# Patient Record
Sex: Male | Born: 1985 | Hispanic: Yes | Marital: Married | Smoking: Never smoker
Health system: Southern US, Community
[De-identification: ages and names within clinical notes are randomized; demographics above are authoritative.]

## PROBLEM LIST (undated history)

## (undated) DIAGNOSIS — E559 Vitamin D deficiency, unspecified: Secondary | ICD-10-CM

## (undated) DIAGNOSIS — K219 Gastro-esophageal reflux disease without esophagitis: Secondary | ICD-10-CM

## (undated) DIAGNOSIS — D709 Neutropenia, unspecified: Secondary | ICD-10-CM

## (undated) DIAGNOSIS — R0982 Postnasal drip: Secondary | ICD-10-CM

## (undated) HISTORY — DX: Gastro-esophageal reflux disease without esophagitis: K21.9

## (undated) HISTORY — DX: Vitamin D deficiency, unspecified: E55.9

## (undated) HISTORY — DX: Postnasal drip: R09.82

## (undated) HISTORY — DX: Neutropenia, unspecified: D70.9

---

## 2005-06-21 HISTORY — PX: TONSILLECTOMY: SUR1361

## 2014-06-12 ENCOUNTER — Encounter (HOSPITAL_COMMUNITY): Payer: Self-pay | Admitting: Emergency Medicine

## 2014-06-12 ENCOUNTER — Emergency Department (HOSPITAL_COMMUNITY)
Admission: EM | Admit: 2014-06-12 | Discharge: 2014-06-12 | Disposition: A | Discharge status: Home / Self Care | Payer: 59 | Source: Home / Self Care | Attending: Emergency Medicine | Admitting: Emergency Medicine

## 2014-06-12 DIAGNOSIS — L989 Disorder of the skin and subcutaneous tissue, unspecified: Secondary | ICD-10-CM

## 2014-06-12 NOTE — ED Notes (Signed)
C/o small abrasion on lateral malleolus of left foot onset 3 weeks Mild pain; 1/10; just concerned it maybe infected Denies inj/trauma Steady gait; NAD Alert, no signs of acute distress.

## 2014-06-12 NOTE — ED Provider Notes (Signed)
CSN: [id number]     Arrival date & time 06/12/14  1012 History   First MD Initiated Contact with Patient 06/12/14 1054     Chief Complaint  Patient presents with  . Abrasion   (Consider location/radiation/quality/duration/timing/severity/associated sxs/prior Treatment) HPI Comments: Patient presents for evaluation of skin lesion that has been present on his left lateral ankle at lateral malleolus for the past 3 weeks. States area began as a small area of redness and was pruritic for the first week. Was never raised and no drainage or blistering has occurred. Cannot recall injury. No additional skin lesions noted. Feels otherwise well and reports himself to be in good health.  PCP: none   The history is provided by the patient.    History reviewed. No pertinent past medical history. History reviewed. No pertinent past surgical history. No family history on file. History  Substance Use Topics  . Smoking status: Never Smoker   . Smokeless tobacco: Not on file  . Alcohol Use: Yes    Review of Systems  All other systems reviewed and are negative.   Allergies  Review of patient's allergies indicates no known allergies.  Home Medications   Prior to Admission medications   Not on File   BP 119/86 mmHg  Pulse 91  Temp(Src) 98.2 F (36.8 C) (Oral)  Resp 16  SpO2 97% Physical Exam  Constitutional: He is oriented to person, place, and time. He appears well-developed and well-nourished.  HENT:  Head: Normocephalic and atraumatic.  Eyes: Conjunctivae are normal.  Cardiovascular: Normal rate.   Pulmonary/Chest: Effort normal.  Musculoskeletal: Normal range of motion.  Neurological: He is alert and oriented to person, place, and time.  Skin: Skin is warm and dry.  Small 1 cm annular red-purple hyperpigmented flat non-tender, non-blanching skin lesion at ankle over left lateral malleolus.   Psychiatric: He has a normal mood and affect. His behavior is normal.  Nursing note and  vitals reviewed.   ED Course  Procedures (including critical care time) Labs Review Labs Reviewed - No data to display  Imaging Review No results found.   MDM   1. Skin lesion    No clinical evidence of infection or abscess. Advised continued observation at home with arrangements to be made by patient to follow up with dermatology for evaluation and possible biopsy should condition persist.    Ria ClockJennifer Lee H Mosi Hannold, PA 06/12/14 1132

## 2014-06-12 NOTE — Discharge Instructions (Signed)
Continue to observe at home and if lesion persists for another 1-2 weeks, please make arrangements to be seen by the dermatologist of your choice for further evaluation.

## 2019-04-10 ENCOUNTER — Telehealth: Payer: Self-pay | Admitting: Hematology and Oncology

## 2019-04-10 NOTE — Telephone Encounter (Signed)
Received a new hem referral from [facility] for neutropenia. Mr. Jay Manning has been cld and scheduled to see Dr. Lorenso Courier on 10/30 at 2pm. Pt aware to arrive 15 minutes early. He preferred a Friday afternoon appt.

## 2019-04-19 NOTE — Progress Notes (Signed)
[facility] [phone]) [phone]   [phone]  INITIAL CONSULT NOTE  Patient Care Team: Eliezer Lofts, MD as PCP - General (General Practice)  Hematological/Oncological History # Mild Neutropenia 1) 09/10/2015: ANC 1051 2) 10/03/2017: ANC 1724 3) 11/13/2018: WBC 2.9, ANC 1035 4) 04/20/19: Establish Care with Dr. Leonides Schanz   CHIEF COMPLAINTS/PURPOSE OF CONSULTATION:  Mild Neutropenia  HISTORY OF PRESENTING ILLNESS:  Jay Manning 33 y.o. male with medical history significant for GERD, post-nasal drip, and vitamin D deficiency who presents for evaluation of neutropenia.   On review of the records from [facility] on [address], Jay Manning follows with his PCP there who has been treating his vitamin D deficiency. On his last set of routine labs (11/13/18) he was noted to have a WBC 2.9 (Nml 4.0-10.5) with Neutrophil % at 35.7% (nml 45-80%). His Hgb, Plt, and CMP were all otherwise WNL.   On exam today Jay Manning notes he feels well. He currently has the outbreak of a rash on his forehead that developed due to stress. He has been working longer hours at [employer] (50 hours/ week). For this rash his PCP has given him a steroid cream. He also notes he had started on a gluten free diet approximately 1 year ago and when he does eat gluten he gets gas and loose stools. He last ate gluten approximately 2-3 weeks ago with a few slices of bread. He does have a history of post-nasal drip and sinus infections, though he notes he has not required antibiotics since his tonsils were removed in approximately 2007. He has had 10 lbs of weight loss, but is engaging in a low carb diet. He otherwise has no new concerning symptoms. Of note, he does endorse occasional use of hallucinogenic mushrooms.   On further discussion Jay Manning notes no family history of any hematological disorders. In terms of his genealogy, he reports he is from [place] and moved to [place]  approximately 6 years ago. His family lineage can be traced to a region of [place] with a high Arab population. He is unsure if he has [place] ancestry as he has not taken a Teacher, music, however he notes his last name implies he likely does.   A full 10 point ROS is listed below.   MEDICAL HISTORY:  Past Medical History:  Diagnosis Date  . GERD (gastroesophageal reflux disease)   . Neutropenia (HCC)   . Post-nasal drip   . Vitamin D deficiency     SURGICAL HISTORY: Past Surgical History:  Procedure Laterality Date  . TONSILLECTOMY  2007    SOCIAL HISTORY: Social History   Socioeconomic History  . Marital status: Married    Spouse name: Not on file  . Number of children: 0  . Years of education: Not on file  . Highest education level: Not on file  Occupational History  . Not on file  Social Needs  . Financial resource strain: Not on file  . Food insecurity    Worry: Not on file    Inability: Not on file  . Transportation needs    Medical: Not on file    Non-medical: Not on file  Tobacco Use  . Smoking status: Never Smoker  Substance and Sexual Activity  . Alcohol use: Yes    Alcohol/week: 7.0 - 14.0 standard drinks    Types: 7 - 14 Standard drinks or equivalent per week  . Drug use: Yes    Types: Psilocybin  .  Sexual activity: Not on file  Lifestyle  . Physical activity    Days per week: Not on file    Minutes per session: Not on file  . Stress: Not on file  Relationships  . Social Musicianconnections    Talks on phone: Not on file    Gets together: Not on file    Attends religious service: Not on file    Active member of club or organization: Not on file    Attends meetings of clubs or organizations: Not on file    Relationship status: Not on file  . Intimate partner violence    Fear of current or ex partner: Not on file    Emotionally abused: Not on file    Physically abused: Not on file    Forced sexual activity: Not on file  Other Topics Concern  .  Not on file  Social History Narrative  . Not on file    FAMILY HISTORY: Family History  Problem Relation Age of Onset  . Hypothyroidism Mother   . Diabetes Maternal Grandmother   . Diabetes Maternal Grandfather   . Heart attack Paternal Grandmother   . Gout Paternal Grandfather     ALLERGIES:  has No Known Allergies.  MEDICATIONS:  Current Outpatient Medications  Medication Sig Dispense Refill  . fluocinolone (VANOS) 0.01 % cream Apply topically 2 (two) times daily.    Marland Kitchen. omeprazole (PRILOSEC) 20 MG capsule Take 20 mg by mouth daily.     No current facility-administered medications for this visit.     REVIEW OF SYSTEMS:   Constitutional: ( - ) fevers, ( - )  chills , ( - ) night sweats Eyes: ( - ) blurriness of vision, ( - ) double vision, ( - ) watery eyes Ears, nose, mouth, throat, and face: ( - ) mucositis, ( - ) sore throat Respiratory: ( - ) cough, ( - ) dyspnea, ( - ) wheezes Cardiovascular: ( - ) palpitation, ( - ) chest discomfort, ( - ) lower extremity swelling Gastrointestinal:  ( - ) nausea, ( + ) heartburn, ( - ) change in bowel habits Skin: ( + ) abnormal skin rashes Lymphatics: ( - ) new lymphadenopathy, ( - ) easy bruising Neurological: ( - ) numbness, ( - ) tingling, ( - ) new weaknesses Behavioral/Psych: ( - ) mood change, ( - ) new changes  All other systems were reviewed with the patient and are negative.  PHYSICAL EXAMINATION: ECOG PERFORMANCE STATUS: 0 - Asymptomatic  Vitals:   04/20/19 1402  BP: 116/76  Pulse: 72  Resp: 18  Temp: 98.5 F (36.9 C)  SpO2: 100%   Filed Weights   04/20/19 1402  Weight: 144 lb 12.8 oz (65.7 kg)    GENERAL: well appearing young Hispanic male in NAD  SKIN: mild erythematous rash over the left side of the forehead. EYES: conjunctiva are pink and non-injected, sclera clear LYMPH:  no palpable lymphadenopathy in the cervical, axillary or supraclavicular LUNGS: clear to auscultation and percussion with normal  breathing effort HEART: regular rate & rhythm and no murmurs and no lower extremity edema ABDOMEN: soft, non-tender, non-distended, normal bowel sounds Musculoskeletal: no cyanosis of digits and no clubbing  PSYCH: alert & oriented x 3, fluent speech NEURO: no focal motor/sensory deficits  LABORATORY DATA:  I have reviewed the data as listed 09/10/2015: ANC 1051  10/03/2017: ANC 1724 11/13/2018: WBC 2.9, ANC 1035, Hgb 14.9, Plt 290   BLOOD FILM:  I personally reviewed the  patient's peripheral blood smear today.  There were no peripheral blasts.  The white blood cells were of normal morphology with no premature forms or hypersegmented neutrophils. RBC were of normal shape and size. There was no schistocytosis or anisocytosis.  The platelets are increased in size with occasional clumping episode.   RADIOGRAPHIC STUDIES: No results found.  ASSESSMENT & PLAN Jay Manning 33 y.o. male with medical history significant for GERD, post-nasal drip, and vitamin D deficiency who presents for evaluation of neutropenia. At this time his results are most consistent with a mild neutropenia, likely benign ethnic neutropenia (on discussion with him he notes ancestry from a place in [place] with a high Arab population, coupled with a last name of Arab lineage). This is effectively a diagnosis of exclusions, so we will perform a full evaluation to assure there are no other etiologies (though, academically, the diagnosis can be supported with blood typing for Duffy antigen). We perform nutritional studies in addition to select viral studies to r/o other causes. It is reassuring his ANC has been stably >1000 for the last 3 years (since at least March 2007). There is no need for routine f/u in our clinic, though we are happy to re-examine him in the event there are further changes in his counts or new symptoms develop.  #Mild Neutropenia --most likely etiology for the current findings are benign ethnic  neutropenia, a relatively common mild decrease in the neutrophil count with little clinical significance.  --will f/u CBC w/ diff, CMP, Vitamin b12, folate levels today --it is worthwhile to also assess for HIV, Hep B, and Hep C --review of peripheral blood films shows normal WBC morphology, which is reassuring. --strict return precautions for fever, bleeding, abdominal swelling/lymphadenopathy --RTC PRN, recommend PCP continue checking his WBC during his annual visits to assure no significant changes.   Orders Placed This Encounter  Procedures  . CBC with Differential (Cancer Center Only)    Standing Status:   Future    Number of Occurrences:   1    Standing Expiration Date:   04/19/2020  . Save Smear (SSMR)    Standing Status:   Future    Number of Occurrences:   1    Standing Expiration Date:   04/19/2020  . CMP (Cancer Center only)    Standing Status:   Future    Number of Occurrences:   1    Standing Expiration Date:   04/19/2020  . Lactate dehydrogenase (LDH)    Standing Status:   Future    Number of Occurrences:   1    Standing Expiration Date:   04/19/2020  . Iron and TIBC    Standing Status:   Future    Number of Occurrences:   1    Standing Expiration Date:   04/19/2020  . Ferritin    Standing Status:   Future    Number of Occurrences:   1    Standing Expiration Date:   04/19/2020  . Vitamin B12    Standing Status:   Future    Number of Occurrences:   1    Standing Expiration Date:   04/19/2020  . Folate, Serum    Standing Status:   Future    Number of Occurrences:   1    Standing Expiration Date:   04/19/2020  . Hepatitis C antibody    Standing Status:   Future    Number of Occurrences:   1    Standing Expiration Date:  04/19/2020  . Hepatitis B surface antigen    Standing Status:   Future    Number of Occurrences:   1    Standing Expiration Date:   04/19/2020  . Hepatitis B surface antibody    Standing Status:   Future    Number of Occurrences:   1     Standing Expiration Date:   04/19/2020  . Hepatitis B core antibody, total    Standing Status:   Future    Number of Occurrences:   1    Standing Expiration Date:   04/19/2020  . HIV antibody (with reflex)    Standing Status:   Future    Number of Occurrences:   1    Standing Expiration Date:   04/19/2020    All questions were answered. The patient knows to call the clinic with any problems, questions or concerns.  A total of more than 60 minutes were spent face-to-face with the patient during this encounter and over half of that time was spent on counseling and coordination of care as outlined above.   Ledell Peoples, MD Department of Hematology/Oncology [place] [facility] Phone: [phone] Pager: [phone] Email: [email or web address]   04/20/2019 3:47 PM   Literature Support:  Haddy TB, Rana SR, Vita Erm. Benign ethnic neutropenia: what is a normal absolute neutrophil count? Calvary Jan;133(1):15-22. doi: 10.1053/[id number]. PMID: [id number].  --Approximately 25% to 50% of persons of African descent and some ethnic groups in the [place] have benign ethnic neutropenia, with low leukocyte and neutrophil counts. Many individuals, however, maintain consistently low absolute neutrophil counts without evidence of increased susceptibility to infection or any other adverse effect.

## 2019-04-20 ENCOUNTER — Inpatient Hospital Stay: Payer: 59 | Attending: Hematology and Oncology | Admitting: Hematology and Oncology

## 2019-04-20 ENCOUNTER — Inpatient Hospital Stay: Payer: 59

## 2019-04-20 ENCOUNTER — Other Ambulatory Visit: Payer: Self-pay

## 2019-04-20 ENCOUNTER — Encounter: Payer: Self-pay | Admitting: Hematology and Oncology

## 2019-04-20 VITALS — BP 116/76 | HR 72 | Temp 98.5°F | Resp 18 | Ht 67.0 in | Wt 144.8 lb

## 2019-04-20 DIAGNOSIS — K219 Gastro-esophageal reflux disease without esophagitis: Secondary | ICD-10-CM | POA: Diagnosis not present

## 2019-04-20 DIAGNOSIS — D708 Other neutropenia: Secondary | ICD-10-CM

## 2019-04-20 DIAGNOSIS — E559 Vitamin D deficiency, unspecified: Secondary | ICD-10-CM | POA: Diagnosis not present

## 2019-04-20 DIAGNOSIS — D709 Neutropenia, unspecified: Secondary | ICD-10-CM

## 2019-04-20 LAB — CBC WITH DIFFERENTIAL (CANCER CENTER ONLY)
Abs Immature Granulocytes: 0.01 10*3/uL (ref 0.00–0.07)
Basophils Absolute: 0 10*3/uL (ref 0.0–0.1)
Basophils Relative: 1 %
Eosinophils Absolute: 0 10*3/uL (ref 0.0–0.5)
Eosinophils Relative: 1 %
HCT: 43.6 % (ref 39.0–52.0)
Hemoglobin: 15 g/dL (ref 13.0–17.0)
Immature Granulocytes: 0 %
Lymphocytes Relative: 44 %
Lymphs Abs: 1.6 10*3/uL (ref 0.7–4.0)
MCH: 31.3 pg (ref 26.0–34.0)
MCHC: 34.4 g/dL (ref 30.0–36.0)
MCV: 91 fL (ref 80.0–100.0)
Monocytes Absolute: 0.4 10*3/uL (ref 0.1–1.0)
Monocytes Relative: 10 %
Neutro Abs: 1.7 10*3/uL (ref 1.7–7.7)
Neutrophils Relative %: 44 %
Platelet Count: 276 10*3/uL (ref 150–400)
RBC: 4.79 MIL/uL (ref 4.22–5.81)
RDW: 12.1 % (ref 11.5–15.5)
WBC Count: 3.7 10*3/uL — ABNORMAL LOW (ref 4.0–10.5)
nRBC: 0 % (ref 0.0–0.2)

## 2019-04-20 LAB — CMP (CANCER CENTER ONLY)
ALT: 18 U/L (ref 0–44)
AST: 22 U/L (ref 15–41)
Albumin: 4.7 g/dL (ref 3.5–5.0)
Alkaline Phosphatase: 49 U/L (ref 38–126)
Anion gap: 11 (ref 5–15)
BUN: 18 mg/dL (ref 6–20)
CO2: 26 mmol/L (ref 22–32)
Calcium: 9.4 mg/dL (ref 8.9–10.3)
Chloride: 103 mmol/L (ref 98–111)
Creatinine: 1.03 mg/dL (ref 0.61–1.24)
GFR, Est AFR Am: >60 mL/min (ref >60)
GFR, Estimated: >60 mL/min (ref >60)
Glucose, Bld: 89 mg/dL (ref 70–99)
Potassium: 3.9 mmol/L (ref 3.5–5.1)
Sodium: 140 mmol/L (ref 135–145)
Total Bilirubin: 0.8 mg/dL (ref 0.3–1.2)
Total Protein: 7.7 g/dL (ref 6.5–8.1)

## 2019-04-20 LAB — HEPATITIS B SURFACE ANTIGEN: Hepatitis B Surface Ag: NONREACTIVE

## 2019-04-20 LAB — FOLATE: Folate: 3.2 ng/mL — ABNORMAL LOW (ref >5.9)

## 2019-04-20 LAB — HEPATITIS C ANTIBODY: HCV Ab: NONREACTIVE

## 2019-04-20 LAB — LACTATE DEHYDROGENASE: LDH: 162 U/L (ref 98–192)

## 2019-04-20 LAB — SAVE SMEAR(SSMR), FOR PROVIDER SLIDE REVIEW

## 2019-04-20 LAB — HIV ANTIBODY (ROUTINE TESTING W REFLEX): HIV Screen 4th Generation wRfx: NONREACTIVE

## 2019-04-20 LAB — VITAMIN B12: Vitamin B-12: 232 pg/mL (ref 180–914)

## 2019-04-21 LAB — HEPATITIS B SURFACE ANTIBODY,QUALITATIVE: Hep B S Ab: REACTIVE — AB

## 2019-04-21 LAB — HEPATITIS B CORE ANTIBODY, TOTAL: Hep B Core Total Ab: NONREACTIVE

## 2019-04-23 LAB — IRON AND TIBC
Iron: 92 ug/dL (ref 42–163)
Saturation Ratios: 30 % (ref 20–55)
TIBC: 303 ug/dL (ref 202–409)
UIBC: 210 ug/dL (ref 117–376)

## 2019-04-23 LAB — FERRITIN: Ferritin: 142 ng/mL (ref 24–336)

## 2019-08-31 ENCOUNTER — Ambulatory Visit: Payer: 59 | Attending: Internal Medicine

## 2019-08-31 DIAGNOSIS — Z23 Encounter for immunization: Secondary | ICD-10-CM

## 2019-08-31 NOTE — Progress Notes (Signed)
   Covid-19 Vaccination Clinic  Name:  Jay Manning    MRN: [id number] DOB: 1986/04/15  08/31/2019  Mr. Jay Manning was observed post Covid-19 immunization for 15 minutes without incident. He was provided with Vaccine Information Sheet and instruction to access the V-Safe system.   Mr. Jay Manning was instructed to call 911 with any severe reactions post vaccine: Marland Kitchen Difficulty breathing  . Swelling of face and throat  . A fast heartbeat  . A bad rash all over body  . Dizziness and weakness   Immunizations Administered    Name Date Dose VIS Date Route   Pfizer COVID-19 Vaccine 08/31/2019  8:27 AM 0.3 mL 06/01/2019 Intramuscular   Manufacturer: [employer], [employer]   Lot: [id number]   NDC: [address]-3

## 2019-09-25 ENCOUNTER — Ambulatory Visit: Payer: 59 | Attending: Internal Medicine

## 2019-09-25 DIAGNOSIS — Z23 Encounter for immunization: Secondary | ICD-10-CM

## 2019-09-25 NOTE — Progress Notes (Signed)
   Covid-19 Vaccination Clinic  Name:  Jay Manning    MRN: [id number] DOB: 04-Nov-1985  09/25/2019  Mr. Jay Manning was observed post Covid-19 immunization for 15 minutes without incident. He was provided with Vaccine Information Sheet and instruction to access the V-Safe system.   Mr. Jay Manning was instructed to call 911 with any severe reactions post vaccine: Marland Kitchen Difficulty breathing  . Swelling of face and throat  . A fast heartbeat  . A bad rash all over body  . Dizziness and weakness   Immunizations Administered    Name Date Dose VIS Date Route   Pfizer COVID-19 Vaccine 09/25/2019  9:00 AM 0.3 mL 06/01/2019 Intramuscular   Manufacturer: [employer], [employer]   Lot: [id number]   NDC: [address]-2

## 2020-01-16 ENCOUNTER — Ambulatory Visit (HOSPITAL_COMMUNITY)
Admission: EM | Admit: 2020-01-16 | Discharge: 2020-01-16 | Disposition: A | Discharge status: Home / Self Care | Payer: BC Managed Care – PPO | Attending: Family Medicine | Admitting: Family Medicine

## 2020-01-16 ENCOUNTER — Encounter (HOSPITAL_COMMUNITY): Payer: Self-pay

## 2020-01-16 ENCOUNTER — Other Ambulatory Visit: Payer: Self-pay

## 2020-01-16 DIAGNOSIS — E559 Vitamin D deficiency, unspecified: Secondary | ICD-10-CM | POA: Diagnosis not present

## 2020-01-16 DIAGNOSIS — Z79899 Other long term (current) drug therapy: Secondary | ICD-10-CM | POA: Insufficient documentation

## 2020-01-16 DIAGNOSIS — D709 Neutropenia, unspecified: Secondary | ICD-10-CM | POA: Diagnosis not present

## 2020-01-16 DIAGNOSIS — K219 Gastro-esophageal reflux disease without esophagitis: Secondary | ICD-10-CM | POA: Insufficient documentation

## 2020-01-16 DIAGNOSIS — J069 Acute upper respiratory infection, unspecified: Secondary | ICD-10-CM

## 2020-01-16 DIAGNOSIS — Z20822 Contact with and (suspected) exposure to covid-19: Secondary | ICD-10-CM | POA: Diagnosis not present

## 2020-01-16 LAB — SARS CORONAVIRUS 2 (TAT 6-24 HRS): SARS Coronavirus 2: NEGATIVE

## 2020-01-16 MED ORDER — BENZONATATE 200 MG PO CAPS: 200.0000 mg | ORAL_CAPSULE | Freq: Three times a day (TID) | ORAL | 0 refills | Status: AC | PRN

## 2020-01-16 NOTE — ED Triage Notes (Signed)
Pt presents with complaints of cough, sore throat and congestion x 3 days. Concerned for covid. Patient is vaccinated.

## 2020-01-16 NOTE — ED Provider Notes (Signed)
[facility]    CSN: [id number] Arrival date & time: 01/16/20  1454      History   Chief Complaint Chief Complaint  Patient presents with  . Cough    HPI Jay Manning is a 34 y.o. male history of GERD presenting today for evaluation of URI symptoms.  Patient reports over the past 3 days he has had cough congestion and sore throat.  Symptoms have felt mainly in throat rather than in chest.  Denies chest pain or shortness of breath.  Denies any fevers.  Patient is fully vaccinated, but is concerned about possible Covid.  Denies exposure.  HPI  Past Medical History:  Diagnosis Date  . GERD (gastroesophageal reflux disease)   . Neutropenia (HCC)   . Post-nasal drip   . Vitamin D deficiency     Patient Active Problem List   Diagnosis Date Noted  . Neutropenia (HCC) 04/20/2019  . Vitamin D deficiency 04/20/2019    Past Surgical History:  Procedure Laterality Date  . TONSILLECTOMY  2007       Home Medications    Prior to Admission medications   Medication Sig Start Date End Date Taking? Authorizing Provider  benzonatate (TESSALON) 200 MG capsule Take 1 capsule (200 mg total) by mouth 3 (three) times daily as needed for up to 7 days for cough. 01/16/20 01/23/20  Shalay Carder C, PA-C  fluocinolone (VANOS) 0.01 % cream Apply topically 2 (two) times daily.    [provider]  omeprazole (PRILOSEC) 20 MG capsule Take 20 mg by mouth daily.    [provider]    Family History Family History  Problem Relation Age of Onset  . Hypothyroidism Mother   . Diabetes Maternal Grandmother   . Diabetes Maternal Grandfather   . Heart attack Paternal Grandmother   . Gout Paternal Grandfather     Social History Social History   Tobacco Use  . Smoking status: Never Smoker  . Smokeless tobacco: Never Used  Substance Use Topics  . Alcohol use: Yes    Alcohol/week: 7.0 - 14.0 standard drinks    Types: 7 - 14 Standard drinks or equivalent  per week  . Drug use: Yes    Types: Psilocybin     Allergies   Patient has no known allergies.   Review of Systems Review of Systems  Constitutional: Negative for activity change, appetite change, chills, fatigue and fever.  HENT: Positive for congestion, postnasal drip and sore throat. Negative for ear pain, rhinorrhea, sinus pressure and trouble swallowing.   Eyes: Negative for discharge and redness.  Respiratory: Positive for cough. Negative for chest tightness and shortness of breath.   Cardiovascular: Negative for chest pain.  Gastrointestinal: Negative for abdominal pain, diarrhea, nausea and vomiting.  Musculoskeletal: Negative for myalgias.  Skin: Negative for rash.  Neurological: Negative for dizziness, light-headedness and headaches.     Physical Exam Triage Vital Signs ED Triage Vitals  Enc Vitals Group     BP      Pulse      Resp      Temp      Temp src      SpO2      Weight      Height      Head Circumference      Peak Flow      Pain Score      Pain Loc      Pain Edu?      Excl. in GC?  No data found.  Updated Vital Signs BP 119/77   Pulse 72   Temp 98.8 F (37.1 C)   Resp 18   SpO2 100%   Visual Acuity Right Eye Distance:   Left Eye Distance:   Bilateral Distance:    Right Eye Near:   Left Eye Near:    Bilateral Near:     Physical Exam Vitals and nursing note reviewed.  Constitutional:      Appearance: He is well-developed.     Comments: No acute distress  HENT:     Head: Normocephalic and atraumatic.     Ears:     Comments: Bilateral ears without tenderness to palpation of external auricle, tragus and mastoid, EAC's without erythema or swelling, TM's with good bony landmarks and cone of light. Non erythematous.     Nose: Nose normal.     Mouth/Throat:     Comments: Oral mucosa pink and moist, no tonsillar enlargement or exudate. Posterior pharynx patent and nonerythematous, no uvula deviation or swelling. Normal  phonation.  Eyes:     Conjunctiva/sclera: Conjunctivae normal.  Cardiovascular:     Rate and Rhythm: Normal rate.  Pulmonary:     Effort: Pulmonary effort is normal. No respiratory distress.     Comments: Breathing comfortably at rest, CTABL, no wheezing, rales or other adventitious sounds auscultated Abdominal:     General: There is no distension.  Musculoskeletal:        General: Normal range of motion.     Cervical back: Neck supple.  Skin:    General: Skin is warm and dry.  Neurological:     Mental Status: He is alert and oriented to person, place, and time.      UC Treatments / Results  Labs (all labs ordered are listed, but only abnormal results are displayed) Labs Reviewed  SARS CORONAVIRUS 2 (TAT 6-24 HRS)    EKG   Radiology No results found.  Procedures Procedures (including critical care time)  Medications Ordered in UC Medications - No data to display  Initial Impression / Assessment and Plan / UC Course  I have reviewed the triage vital signs and the nursing notes.  Pertinent labs & imaging results that were available during my care of the patient were reviewed by me and considered in my medical decision making (see chart for details).     Covid test pending, monitor my chart for results.  Suspect likely viral etiology of URI symptoms and recommending symptomatic and supportive care, rest and fluids.  Continue to monitor for gradual resolution.Discussed strict return precautions. Patient verbalized understanding and is agreeable with plan.  Final Clinical Impressions(s) / UC Diagnoses   Final diagnoses:  Viral URI with cough     Discharge Instructions     Covid test pending, monitor my chart for results Rest and fluids Use daily cetirizine/Zyrtec or Claritin/loratadine-may be generic over-the-counter daily to help with postnasal drainage May continue DayQuil as needed Tessalon/benzonatate every 8 hours as needed for cough Honey and hot  tea  Follow-up if not improving or worsening    ED Prescriptions    Medication Sig Dispense Auth. Provider   benzonatate (TESSALON) 200 MG capsule Take 1 capsule (200 mg total) by mouth 3 (three) times daily as needed for up to 7 days for cough. 28 capsule Annalise Mcdiarmid, [place] C, PA-C     PDMP not reviewed this encounter.   Lew Dawes, PA-C 01/16/20 1706

## 2020-01-16 NOTE — Discharge Instructions (Signed)
Covid test pending, monitor my chart for results Rest and fluids Use daily cetirizine/Zyrtec or Claritin/loratadine-may be generic over-the-counter daily to help with postnasal drainage May continue DayQuil as needed Tessalon/benzonatate every 8 hours as needed for cough Honey and hot tea  Follow-up if not improving or worsening

## 2020-06-11 ENCOUNTER — Other Ambulatory Visit: Payer: Self-pay

## 2020-06-11 ENCOUNTER — Ambulatory Visit (HOSPITAL_COMMUNITY)
Admission: EM | Admit: 2020-06-11 | Discharge: 2020-06-11 | Disposition: A | Discharge status: Home / Self Care | Payer: BC Managed Care – PPO | Attending: Family Medicine | Admitting: Family Medicine

## 2020-06-11 ENCOUNTER — Encounter (HOSPITAL_COMMUNITY): Payer: Self-pay

## 2020-06-11 DIAGNOSIS — R109 Unspecified abdominal pain: Secondary | ICD-10-CM | POA: Diagnosis not present

## 2020-06-11 DIAGNOSIS — Z79899 Other long term (current) drug therapy: Secondary | ICD-10-CM | POA: Insufficient documentation

## 2020-06-11 DIAGNOSIS — Z113 Encounter for screening for infections with a predominantly sexual mode of transmission: Secondary | ICD-10-CM | POA: Insufficient documentation

## 2020-06-11 DIAGNOSIS — R310 Gross hematuria: Secondary | ICD-10-CM | POA: Diagnosis present

## 2020-06-11 LAB — COMPREHENSIVE METABOLIC PANEL
ALT: 26 U/L (ref 0–44)
AST: 25 U/L (ref 15–41)
Albumin: 4.7 g/dL (ref 3.5–5.0)
Alkaline Phosphatase: 47 U/L (ref 38–126)
Anion gap: 14 (ref 5–15)
BUN: 14 mg/dL (ref 6–20)
CO2: 24 mmol/L (ref 22–32)
Calcium: 9.6 mg/dL (ref 8.9–10.3)
Chloride: 100 mmol/L (ref 98–111)
Creatinine, Ser: 1.01 mg/dL (ref 0.61–1.24)
GFR, Estimated: >60 mL/min (ref >60)
Glucose, Bld: 64 mg/dL — ABNORMAL LOW (ref 70–99)
Potassium: 3.9 mmol/L (ref 3.5–5.1)
Sodium: 138 mmol/L (ref 135–145)
Total Bilirubin: 1.3 mg/dL — ABNORMAL HIGH (ref 0.3–1.2)
Total Protein: 8 g/dL (ref 6.5–8.1)

## 2020-06-11 LAB — CBC WITH DIFFERENTIAL/PLATELET
Abs Immature Granulocytes: 0.01 10*3/uL (ref 0.00–0.07)
Basophils Absolute: 0 10*3/uL (ref 0.0–0.1)
Basophils Relative: 1 %
Eosinophils Absolute: 0 10*3/uL (ref 0.0–0.5)
Eosinophils Relative: 1 %
HCT: 47.4 % (ref 39.0–52.0)
Hemoglobin: 16 g/dL (ref 13.0–17.0)
Immature Granulocytes: 0 %
Lymphocytes Relative: 32 %
Lymphs Abs: 1.6 10*3/uL (ref 0.7–4.0)
MCH: 30.8 pg (ref 26.0–34.0)
MCHC: 33.8 g/dL (ref 30.0–36.0)
MCV: 91.2 fL (ref 80.0–100.0)
Monocytes Absolute: 0.5 10*3/uL (ref 0.1–1.0)
Monocytes Relative: 9 %
Neutro Abs: 3 10*3/uL (ref 1.7–7.7)
Neutrophils Relative %: 57 %
Platelets: 304 10*3/uL (ref 150–400)
RBC: 5.2 MIL/uL (ref 4.22–5.81)
RDW: 12.6 % (ref 11.5–15.5)
WBC: 5.1 10*3/uL (ref 4.0–10.5)
nRBC: 0 % (ref 0.0–0.2)

## 2020-06-11 LAB — POCT URINALYSIS DIPSTICK, ED / UC
Glucose, UA: NEGATIVE mg/dL
Ketones, ur: 80 mg/dL — AB
Leukocytes,Ua: NEGATIVE
Nitrite: NEGATIVE
Protein, ur: 30 mg/dL — AB
Specific Gravity, Urine: >=1.03 (ref 1.005–1.030)
Urobilinogen, UA: 0.2 mg/dL (ref 0.0–1.0)
pH: 6 (ref 5.0–8.0)

## 2020-06-11 NOTE — ED Provider Notes (Signed)
[facility]    CSN: [id number] Arrival date & time: 06/11/20  1249      History   Chief Complaint Chief Complaint  Patient presents with   Hematuria   Abdominal Pain    HPI Jay Manning Jay Manning is a 34 y.o. male.   Here today with an episode of mild left flank pain and hematuria that he noticed when urinating at work over lunch time today. States the pain is very mild and not constant. Denies dysuria, frequency, bowel changes, fever, chills, N/V, history of similar issues. So far not trying anything for sxs. States he drinks lots of water daily. No known chronic medical conditions per patient.      Past Medical History:  Diagnosis Date   GERD (gastroesophageal reflux disease)    Neutropenia (HCC)    Post-nasal drip    Vitamin D deficiency     Patient Active Problem List   Diagnosis Date Noted   Neutropenia (HCC) 04/20/2019   Vitamin D deficiency 04/20/2019    Past Surgical History:  Procedure Laterality Date   TONSILLECTOMY  2007       Home Medications    Prior to Admission medications   Medication Sig Start Date End Date Taking? Authorizing Provider  fluocinolone (VANOS) 0.01 % cream Apply topically 2 (two) times daily.    [provider]  omeprazole (PRILOSEC) 20 MG capsule Take 20 mg by mouth daily.    [provider]    Family History Family History  Problem Relation Age of Onset   Hypothyroidism Mother    Diabetes Maternal Grandmother    Diabetes Maternal Grandfather    Heart attack Paternal Grandmother    Gout Paternal Grandfather     Social History Social History   Tobacco Use   Smoking status: Never Smoker   Smokeless tobacco: Never Used  Substance Use Topics   Alcohol use: Yes    Alcohol/week: 7.0 - 14.0 standard drinks    Types: 7 - 14 Standard drinks or equivalent per week   Drug use: Yes    Types: Psilocybin     Allergies   Patient has no known allergies.   Review of  Systems Review of Systems PER HPI    Physical Exam Triage Vital Signs ED Triage Vitals  Enc Vitals Group     BP 06/11/20 1352 (!) 106/57     Pulse Rate 06/11/20 1352 78     Resp 06/11/20 1352 18     Temp 06/11/20 1352 97.7 F (36.5 C)     Temp Source 06/11/20 1352 Temporal     SpO2 06/11/20 1352 100 %     Weight --      Height --      Head Circumference --      Peak Flow --      Pain Score 06/11/20 1350 0     Pain Loc --      Pain Edu? --      Excl. in [place]? --    No data found.  Updated Vital Signs BP (!) 106/57 (BP Location: Right Arm)    Pulse 78    Temp 97.7 F (36.5 C) (Temporal)    Resp 18    SpO2 100%   Visual Acuity Right Eye Distance:   Left Eye Distance:   Bilateral Distance:    Right Eye Near:   Left Eye Near:    Bilateral Near:     Physical Exam Vitals and nursing note reviewed.  Constitutional:      Appearance: Normal appearance.  HENT:     Head: Atraumatic.  Eyes:     Extraocular Movements: Extraocular movements intact.     Conjunctiva/sclera: Conjunctivae normal.  Cardiovascular:     Rate and Rhythm: Normal rate and regular rhythm.  Pulmonary:     Effort: Pulmonary effort is normal.     Breath sounds: Normal breath sounds.  Abdominal:     General: Bowel sounds are normal. There is no distension.     Palpations: Abdomen is soft. There is no mass.     Tenderness: There is abdominal tenderness (minimal LLQ ttp). There is no right CVA tenderness, left CVA tenderness, guarding or rebound.  Musculoskeletal:        General: Normal range of motion.     Cervical back: Normal range of motion and neck supple.  Skin:    General: Skin is warm and dry.  Neurological:     General: No focal deficit present.     Mental Status: He is oriented to person, place, and time.  Psychiatric:        Mood and Affect: Mood normal.        Thought Content: Thought content normal.        Judgment: Judgment normal.      UC Treatments / Results  Labs (all labs  ordered are listed, but only abnormal results are displayed) Labs Reviewed  COMPREHENSIVE METABOLIC PANEL - Abnormal; Notable for the following components:      Result Value   Glucose, Bld 64 (*)    Total Bilirubin 1.3 (*)    All other components within normal limits  POCT URINALYSIS DIPSTICK, ED / UC - Abnormal; Notable for the following components:   Bilirubin Urine SMALL (*)    Ketones, ur 80 (*)    Hgb urine dipstick LARGE (*)    Protein, ur 30 (*)    All other components within normal limits  CBC WITH DIFFERENTIAL/PLATELET  CYTOLOGY, (ORAL, ANAL, URETHRAL) ANCILLARY ONLY    EKG   Radiology No results found.  Procedures Procedures (including critical care time)  Medications Ordered in UC Medications - No data to display  Initial Impression / Assessment and Plan / UC Course  I have reviewed the triage vital signs and the nursing notes.  Pertinent labs & imaging results that were available during my care of the patient were reviewed by me and considered in my medical decision making (see chart for details).     U/A without evidence of infection but showing some protein and large hgb - unclear if kidney stone, bladder irritant? Cytology swab pending though he has virtually no suspicion for this, basic labs drawn. Push fluids, monitor sxs. Urology f/u given in case worsening or not resolving.   Final Clinical Impressions(s) / UC Diagnoses   Final diagnoses:  Gross hematuria   Discharge Instructions   None    ED Prescriptions    None     PDMP not reviewed this encounter.   [name], [place] 06/11/20 [phone]

## 2020-06-11 NOTE — ED Triage Notes (Signed)
Pt presents with blood in urine since this morning. Pt states he felt swelling on the lower left side of his abdomen the moment he was urinating. Pt denies dysuria and lower back pain.

## 2020-06-12 LAB — CYTOLOGY, (ORAL, ANAL, URETHRAL) ANCILLARY ONLY
Chlamydia: NEGATIVE
Comment: NEGATIVE
Comment: NEGATIVE
Comment: NORMAL
Neisseria Gonorrhea: NEGATIVE
Trichomonas: NEGATIVE

## 2020-08-09 ENCOUNTER — Emergency Department (HOSPITAL_COMMUNITY)
Admission: EM | Admit: 2020-08-09 | Discharge: 2020-08-09 | Disposition: A | Discharge status: Home / Self Care | Payer: BC Managed Care – PPO | Attending: Emergency Medicine | Admitting: Emergency Medicine

## 2020-08-09 ENCOUNTER — Other Ambulatory Visit: Payer: Self-pay

## 2020-08-09 ENCOUNTER — Emergency Department (HOSPITAL_COMMUNITY): Payer: BC Managed Care – PPO

## 2020-08-09 ENCOUNTER — Encounter (HOSPITAL_COMMUNITY): Payer: Self-pay

## 2020-08-09 DIAGNOSIS — R109 Unspecified abdominal pain: Secondary | ICD-10-CM | POA: Diagnosis present

## 2020-08-09 DIAGNOSIS — N200 Calculus of kidney: Secondary | ICD-10-CM

## 2020-08-09 DIAGNOSIS — N132 Hydronephrosis with renal and ureteral calculous obstruction: Secondary | ICD-10-CM | POA: Insufficient documentation

## 2020-08-09 DIAGNOSIS — K219 Gastro-esophageal reflux disease without esophagitis: Secondary | ICD-10-CM | POA: Diagnosis not present

## 2020-08-09 LAB — CBC
HCT: 43.4 % (ref 39.0–52.0)
Hemoglobin: 14.8 g/dL (ref 13.0–17.0)
MCH: 31.2 pg (ref 26.0–34.0)
MCHC: 34.1 g/dL (ref 30.0–36.0)
MCV: 91.6 fL (ref 80.0–100.0)
Platelets: 277 10*3/uL (ref 150–400)
RBC: 4.74 MIL/uL (ref 4.22–5.81)
RDW: 12.2 % (ref 11.5–15.5)
WBC: 6.3 10*3/uL (ref 4.0–10.5)
nRBC: 0 % (ref 0.0–0.2)

## 2020-08-09 LAB — BASIC METABOLIC PANEL
Anion gap: 13 (ref 5–15)
BUN: 18 mg/dL (ref 6–20)
CO2: 24 mmol/L (ref 22–32)
Calcium: 9.1 mg/dL (ref 8.9–10.3)
Chloride: 99 mmol/L (ref 98–111)
Creatinine, Ser: 1.25 mg/dL — ABNORMAL HIGH (ref 0.61–1.24)
GFR, Estimated: >60 mL/min (ref >60)
Glucose, Bld: 119 mg/dL — ABNORMAL HIGH (ref 70–99)
Potassium: 3.7 mmol/L (ref 3.5–5.1)
Sodium: 136 mmol/L (ref 135–145)

## 2020-08-09 LAB — URINALYSIS, ROUTINE W REFLEX MICROSCOPIC
Bilirubin Urine: NEGATIVE
Glucose, UA: NEGATIVE mg/dL
Ketones, ur: 80 mg/dL — AB
Leukocytes,Ua: NEGATIVE
Nitrite: NEGATIVE
Protein, ur: 100 mg/dL — AB
RBC / HPF: >50 RBC/hpf — ABNORMAL HIGH (ref 0–5)
Specific Gravity, Urine: 1.029 (ref 1.005–1.030)
pH: 8 (ref 5.0–8.0)

## 2020-08-09 MED ORDER — NAPROXEN 500 MG PO TABS: 500.0000 mg | ORAL_TABLET | Freq: Two times a day (BID) | ORAL | 0 refills | Status: DC

## 2020-08-09 MED ORDER — HYDROCODONE-ACETAMINOPHEN 5-325 MG PO TABS
1.0000 | ORAL_TABLET | ORAL | 0 refills | Status: DC | PRN
Start: 2020-08-09 — End: 2020-08-14

## 2020-08-09 MED ORDER — TAMSULOSIN HCL 0.4 MG PO CAPS: 0.4000 mg | ORAL_CAPSULE | Freq: Every day | ORAL | 0 refills | Status: DC

## 2020-08-09 NOTE — ED Provider Notes (Signed)
[facility] Emergency Department Provider Note MRN:  [id number]  Arrival date & time: 08/09/20     Chief Complaint   Flank Pain   History of Present Illness   Jay Manning is a 35 y.o. year-old male with no pertinent past medical history presenting to the ED with chief complaint of flank pain.  Location: Left flank Duration: 3 hours Onset: Sudden Timing: Constant Description: Ache Severity: Severe Exacerbating/Alleviating Factors: None Associated Symptoms: Nausea Pertinent Negatives: Denies fever, no vomiting, no diarrhea, no chest pain, no shortness of breath.   Review of Systems  A complete 10 system review of systems was obtained and all systems are negative except as noted in the HPI and PMH.   Patient's Health History    Past Medical History:  Diagnosis Date  . GERD (gastroesophageal reflux disease)   . Neutropenia (HCC)   . Post-nasal drip   . Vitamin D deficiency     Past Surgical History:  Procedure Laterality Date  . TONSILLECTOMY  2007    Family History  Problem Relation Age of Onset  . Hypothyroidism Mother   . Diabetes Maternal Grandmother   . Diabetes Maternal Grandfather   . Heart attack Paternal Grandmother   . Gout Paternal Grandfather     Social History   Socioeconomic History  . Marital status: Married    Spouse name: Not on file  . Number of children: 0  . Years of education: Not on file  . Highest education level: Not on file  Occupational History  . Not on file  Tobacco Use  . Smoking status: Never Smoker  . Smokeless tobacco: Never Used  Substance and Sexual Activity  . Alcohol use: Yes    Alcohol/week: 7.0 - 14.0 standard drinks    Types: 7 - 14 Standard drinks or equivalent per week  . Drug use: Yes    Types: Psilocybin  . Sexual activity: Not on file  Other Topics Concern  . Not on file  Social History Narrative  . Not on file   Social Determinants of Health   Financial Resource  Strain: Not on file  Food Insecurity: Not on file  Transportation Needs: Not on file  Physical Activity: Not on file  Stress: Not on file  Social Connections: Not on file  Intimate Partner Violence: Not on file     Physical Exam   Vitals:   08/09/20 0630 08/09/20 0645  BP: 122/77 118/78  Pulse: 71 63  Resp: 13 13  Temp:    SpO2: 100% 100%    CONSTITUTIONAL: Well-appearing, NAD NEURO:  Alert and oriented x 3, no focal deficits EYES:  eyes equal and reactive ENT/NECK:  no LAD, no JVD CARDIO: Regular rate, well-perfused, normal S1 and S2 PULM:  CTAB no wheezing or rhonchi GI/GU:  normal bowel sounds, non-distended, non-tender MSK/SPINE:  No gross deformities, no edema SKIN:  no rash, atraumatic PSYCH:  Appropriate speech and behavior  *Additional and/or pertinent findings included in MDM below  Diagnostic and Interventional Summary    EKG Interpretation  Date/Time:    Ventricular Rate:    PR Interval:    QRS Duration:   QT Interval:    QTC Calculation:   R Axis:     Text Interpretation:        Labs Reviewed  URINALYSIS, ROUTINE W REFLEX MICROSCOPIC - Abnormal; Notable for the following components:      Result Value   Color, Urine AMBER (*)    APPearance  CLOUDY (*)    Hgb urine dipstick MODERATE (*)    Ketones, ur 80 (*)    Protein, ur 100 (*)    RBC / HPF >50 (*)    Bacteria, UA RARE (*)    All other components within normal limits  BASIC METABOLIC PANEL - Abnormal; Notable for the following components:   Glucose, Bld 119 (*)    Creatinine, Ser 1.25 (*)    All other components within normal limits  URINE CULTURE  CBC    CT Renal Stone Study  Final Result      Medications - No data to display   Procedures  /  Critical Care Procedures  ED Course and Medical Decision Making  I have reviewed the triage vital signs, the nursing notes, and pertinent available records from the EMR.  Listed above are laboratory and imaging tests that I personally  ordered, reviewed, and interpreted and then considered in my medical decision making (see below for details).  History suspicious for kidney stone, confirmed with CT.  Mild AKI on labs, stone is 7 mm in the proximal ureter.  No signs of infection in the urine.  Case discussed with Dr. Marlou Porch of [facility], pain is well controlled, appropriate for close outpatient follow-up for possible lithotripsy.       Elmer Sow. Pilar Plate, MD [facility] Emergency Medicine [facility] [email or web address]  Final Clinical Impressions(s) / ED Diagnoses     ICD-10-CM   1. Kidney stone  N20.0     ED Discharge Orders         Ordered    naproxen (NAPROSYN) 500 MG tablet  2 times daily        08/09/20 0650    tamsulosin (FLOMAX) 0.4 MG CAPS capsule  Daily        08/09/20 0650    HYDROcodone-acetaminophen (NORCO/VICODIN) 5-325 MG tablet  Every 4 hours PRN        08/09/20 0650           Discharge Instructions Discussed with and Provided to Patient:     Discharge Instructions     You were evaluated in the Emergency Department and after careful evaluation, we did not find any emergent condition requiring admission or further testing in the hospital.  Your exam/testing today was overall reassuring.  Symptoms seem to be due to a kidney stone.  Your kidney stone is large and may be difficult to pass on your own.  We have discussed your case with Dr. Burnett Sheng of [facility].  You should be receiving a call from him later today to possibly schedule a procedure.  Please return to the Emergency Department if you experience any worsening of your condition.  Thank you for allowing [place] to be a part of your care.       Sabas Sous, MD 08/09/20 [phone]

## 2020-08-09 NOTE — ED Triage Notes (Signed)
Patient arrives from home with [employer], reports sudden onset L sided flank, reports he had some blood in his urine in December and they told him he most likely had a kidney stone, denies hematuria now. Patient reports 4 episodes of vomiting after pain started.   EMS vitals  124/69 16 RR 86 HR 96% RA  30 MG TORADOL 4 MG ZOFRAN

## 2020-08-09 NOTE — Discharge Instructions (Addendum)
You were evaluated in the Emergency Department and after careful evaluation, we did not find any emergent condition requiring admission or further testing in the hospital.  Your exam/testing today was overall reassuring.  Symptoms seem to be due to a kidney stone.  Your kidney stone is large and may be difficult to pass on your own.  We have discussed your case with Dr. Burnett Sheng of [facility].  You should be receiving a call from him later today to possibly schedule a procedure.  Please return to the Emergency Department if you experience any worsening of your condition.  Thank you for allowing [place] to be a part of your care.

## 2020-08-10 LAB — URINE CULTURE: Culture: NO GROWTH

## 2020-08-11 ENCOUNTER — Encounter (HOSPITAL_BASED_OUTPATIENT_CLINIC_OR_DEPARTMENT_OTHER): Payer: Self-pay | Admitting: Urology

## 2020-08-11 ENCOUNTER — Other Ambulatory Visit: Payer: Self-pay | Admitting: Urology

## 2020-08-11 DIAGNOSIS — N201 Calculus of ureter: Secondary | ICD-10-CM

## 2020-08-11 NOTE — Progress Notes (Signed)
Patient to arrive at 1200 on 08/14/20. History and medications reviewed. Pre-procedure instructions given. NPO after MN except for clear liquids until 1000 day of procedure. Driver secured.

## 2020-08-12 ENCOUNTER — Other Ambulatory Visit (HOSPITAL_COMMUNITY)
Admission: RE | Admit: 2020-08-12 | Discharge: 2020-08-12 | Disposition: A | Discharge status: Home / Self Care | Payer: BC Managed Care – PPO | Source: Ambulatory Visit | Attending: Urology | Admitting: Urology

## 2020-08-12 DIAGNOSIS — E739 Lactose intolerance, unspecified: Secondary | ICD-10-CM | POA: Diagnosis not present

## 2020-08-12 DIAGNOSIS — Z79899 Other long term (current) drug therapy: Secondary | ICD-10-CM | POA: Diagnosis not present

## 2020-08-12 DIAGNOSIS — N132 Hydronephrosis with renal and ureteral calculous obstruction: Secondary | ICD-10-CM | POA: Diagnosis not present

## 2020-08-12 DIAGNOSIS — Z01812 Encounter for preprocedural laboratory examination: Secondary | ICD-10-CM | POA: Insufficient documentation

## 2020-08-12 DIAGNOSIS — Z20822 Contact with and (suspected) exposure to covid-19: Secondary | ICD-10-CM | POA: Diagnosis not present

## 2020-08-12 DIAGNOSIS — K9041 Non-celiac gluten sensitivity: Secondary | ICD-10-CM | POA: Diagnosis not present

## 2020-08-12 DIAGNOSIS — N201 Calculus of ureter: Secondary | ICD-10-CM | POA: Diagnosis present

## 2020-08-12 LAB — SARS CORONAVIRUS 2 (TAT 6-24 HRS): SARS Coronavirus 2: NEGATIVE

## 2020-08-14 ENCOUNTER — Ambulatory Visit (HOSPITAL_BASED_OUTPATIENT_CLINIC_OR_DEPARTMENT_OTHER)
Admission: RE | Admit: 2020-08-14 | Discharge: 2020-08-14 | Disposition: A | Discharge status: Home / Self Care | Payer: BC Managed Care – PPO | Attending: Urology | Admitting: Urology

## 2020-08-14 ENCOUNTER — Ambulatory Visit (HOSPITAL_COMMUNITY): Payer: BC Managed Care – PPO

## 2020-08-14 ENCOUNTER — Encounter (HOSPITAL_BASED_OUTPATIENT_CLINIC_OR_DEPARTMENT_OTHER)
Admission: RE | Disposition: A | Discharge status: Home / Self Care | Payer: Self-pay | Source: Home / Self Care | Attending: Urology

## 2020-08-14 ENCOUNTER — Encounter (HOSPITAL_BASED_OUTPATIENT_CLINIC_OR_DEPARTMENT_OTHER): Payer: Self-pay | Admitting: Urology

## 2020-08-14 ENCOUNTER — Other Ambulatory Visit: Payer: Self-pay

## 2020-08-14 DIAGNOSIS — Z20822 Contact with and (suspected) exposure to covid-19: Secondary | ICD-10-CM | POA: Insufficient documentation

## 2020-08-14 DIAGNOSIS — K9041 Non-celiac gluten sensitivity: Secondary | ICD-10-CM | POA: Insufficient documentation

## 2020-08-14 DIAGNOSIS — N132 Hydronephrosis with renal and ureteral calculous obstruction: Secondary | ICD-10-CM | POA: Diagnosis not present

## 2020-08-14 DIAGNOSIS — E739 Lactose intolerance, unspecified: Secondary | ICD-10-CM | POA: Insufficient documentation

## 2020-08-14 DIAGNOSIS — N201 Calculus of ureter: Secondary | ICD-10-CM

## 2020-08-14 DIAGNOSIS — Z79899 Other long term (current) drug therapy: Secondary | ICD-10-CM | POA: Insufficient documentation

## 2020-08-14 HISTORY — PX: EXTRACORPOREAL SHOCK WAVE LITHOTRIPSY: SHX1557

## 2020-08-14 SURGERY — LITHOTRIPSY, ESWL
Anesthesia: LOCAL | Laterality: Left

## 2020-08-14 MED ORDER — CIPROFLOXACIN HCL 500 MG PO TABS
500.0000 mg | ORAL_TABLET | ORAL | Status: AC
  Administered 2020-08-14: 500 mg via ORAL

## 2020-08-14 MED ORDER — DIAZEPAM 5 MG PO TABS
ORAL_TABLET | ORAL | Status: AC
  Filled 2020-08-14: qty 2

## 2020-08-14 MED ORDER — DIPHENHYDRAMINE HCL 25 MG PO CAPS
25.0000 mg | ORAL_CAPSULE | ORAL | Status: AC
  Administered 2020-08-14: 25 mg via ORAL

## 2020-08-14 MED ORDER — DIPHENHYDRAMINE HCL 25 MG PO CAPS
ORAL_CAPSULE | ORAL | Status: AC
  Filled 2020-08-14: qty 1

## 2020-08-14 MED ORDER — HYDROCODONE-ACETAMINOPHEN 5-325 MG PO TABS: 1.0000 | ORAL_TABLET | ORAL | 0 refills | Status: DC | PRN

## 2020-08-14 MED ORDER — DIAZEPAM 5 MG PO TABS
10.0000 mg | ORAL_TABLET | ORAL | Status: AC
  Administered 2020-08-14: 10 mg via ORAL

## 2020-08-14 MED ORDER — SODIUM CHLORIDE 0.9 % IV SOLN: INTRAVENOUS | Status: DC

## 2020-08-14 MED ORDER — CIPROFLOXACIN HCL 500 MG PO TABS
ORAL_TABLET | ORAL | Status: AC
  Filled 2020-08-14: qty 1

## 2020-08-14 NOTE — Op Note (Signed)
[facility] Operative Note  Treating Physician: Rhoderick Moody, MD  Pre-op diagnosis: 7 mm left mid-ureteral pain  Post-op diagnosis: Same   Procedure: LEFT ESWL  See [facility] OP note scanned into chart. Also because of the size, density, location and other factors that cannot be anticipated I feel this will likely be a staged procedure. This fact supersedes any indication in the scanned [place] stone operative note to the contrary

## 2020-08-14 NOTE — H&P (Signed)
See scanned H&P from [facility].  The risks, benefits and alternatives of LEFT ESWL was discussed with the patient. I described the risks which include arrhythmia, kidney contusion, kidney hemorrhage, need for transfusion, back discomfort, flank ecchymosis, flank abrasion, inability to fracture the stone, inability to pass stone fragments, [place], infection associated with obstructing stones, need for an alternative surgical procedure and possible need for repeat shockwave lithotripsy.  The patient voices understanding and wishes to proceed.

## 2020-08-15 ENCOUNTER — Encounter (HOSPITAL_BASED_OUTPATIENT_CLINIC_OR_DEPARTMENT_OTHER): Payer: Self-pay | Admitting: Urology

## 2021-04-07 IMAGING — DX DG ABDOMEN 1V
2 series · 3 of 3 positions shown · non-contrast
Comparison: CT 08/09/2020

CLINICAL DATA: Left ureteral calculus

EXAM:
ABDOMEN - 1 VIEW

[Series 1: abdomen kub · 0.13mm/px · 2 of 2 slices shown (1 of 2)]
[im 1/2]
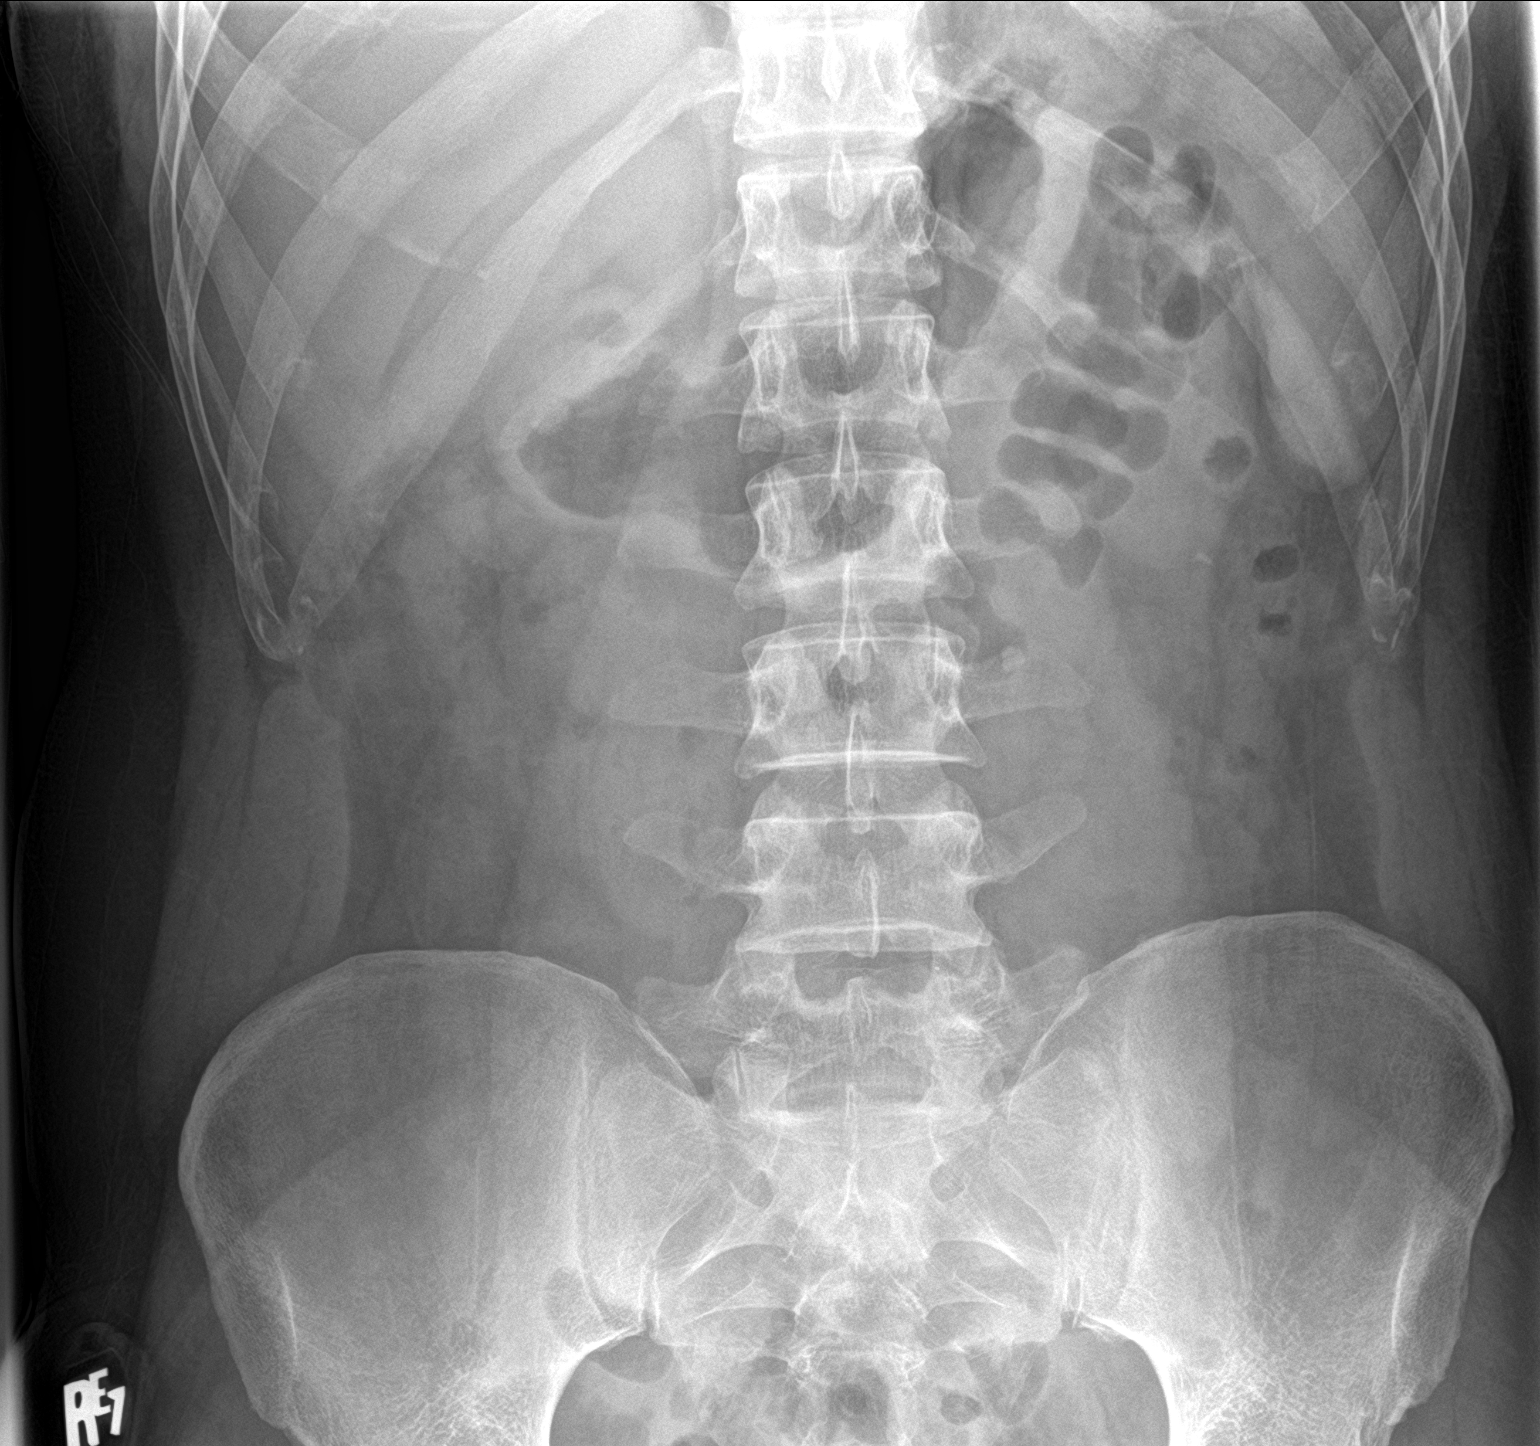
[im 2/2]
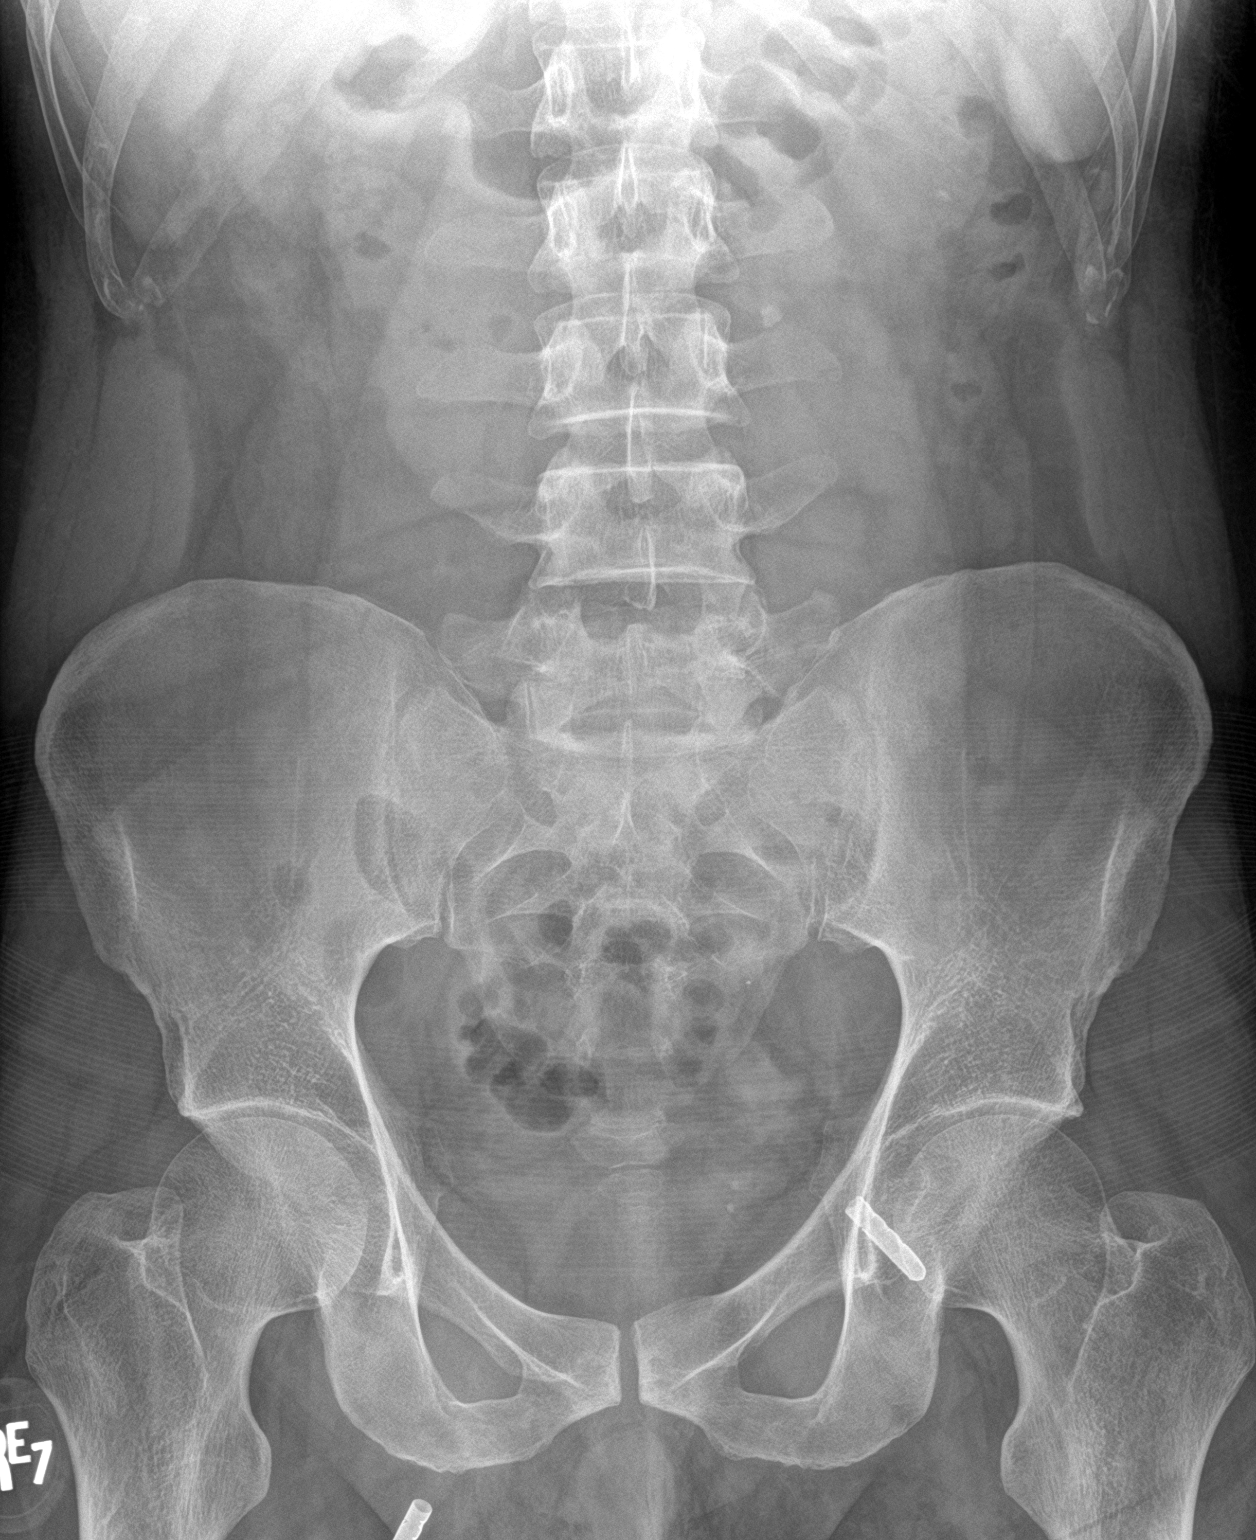

[abdomen kub (2 of 2)]
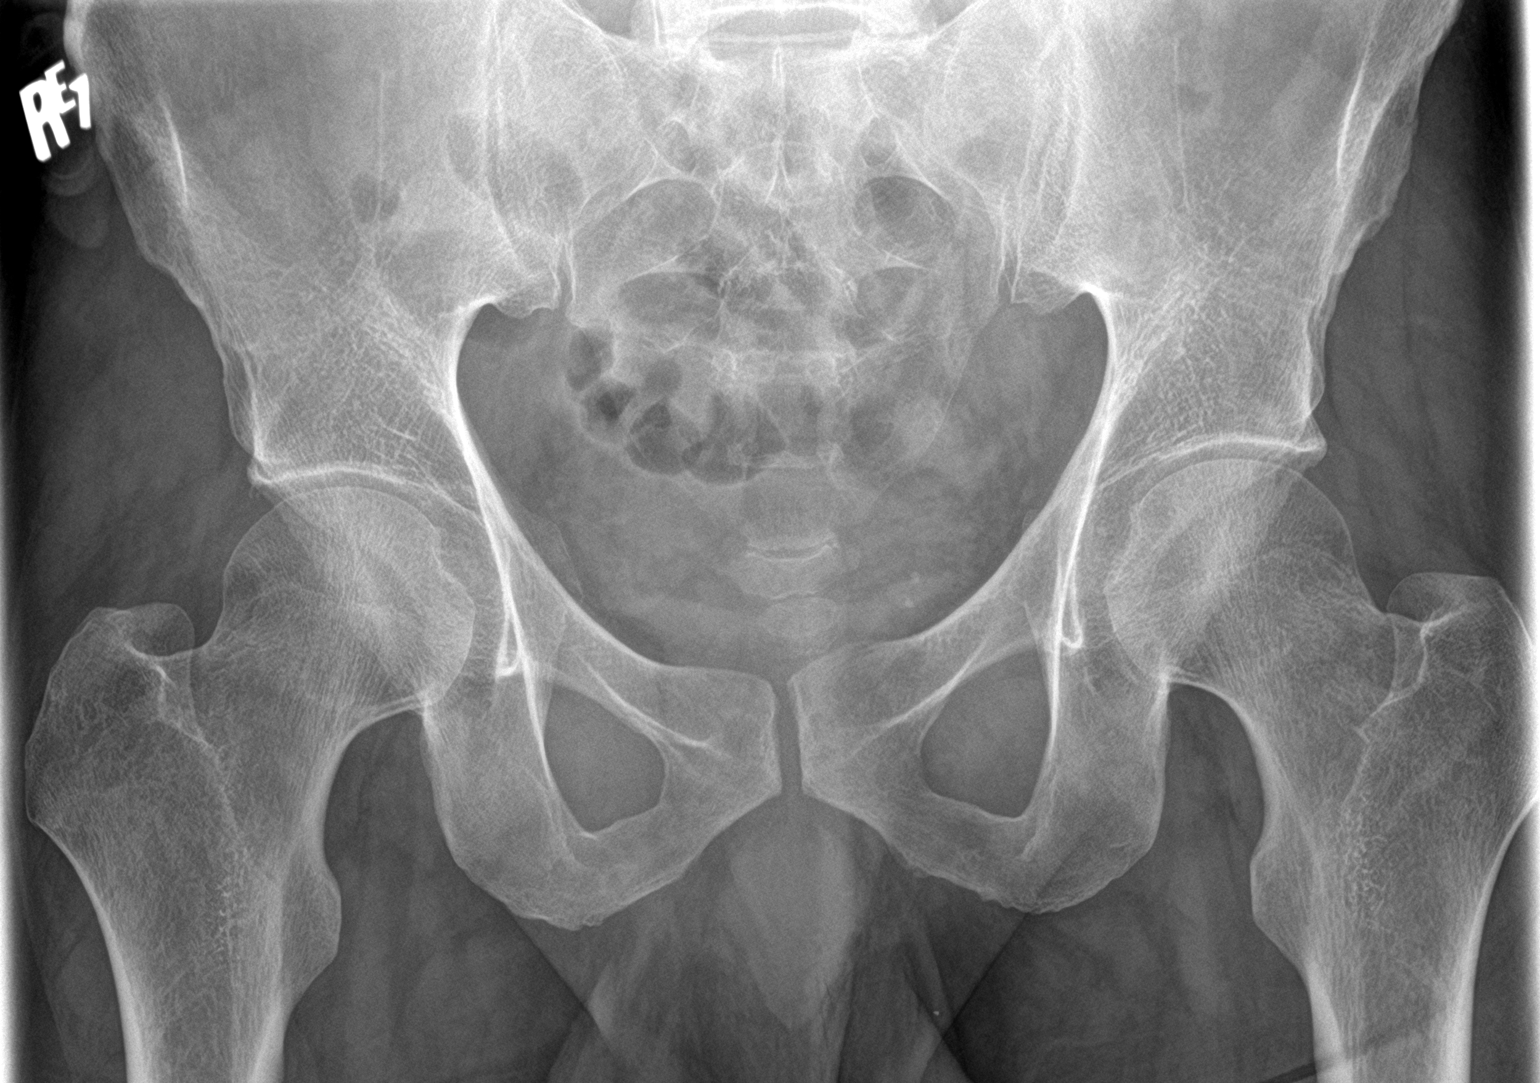

[3 of 3 positions shown; findings below may reference images not displayed]

FINDINGS: 6 mm calculus is unchanged in position overlying the expected
proximal left ureter just above the left transverse process of L3.
No additional renal or ureteral calculi are identified.
Calcification within the left upper quadrant of the abdomen projects
external to the lower pole of the left kidney and likely represents
a calcification within the fecal stream. Several phleboliths are
seen within the left hemipelvis. Normal abdominal gas pattern. No
organomegaly. No acute bone abnormality.
IMPRESSION: Stable 6 mm calculus within the expected proximal left ureter.

## 2021-11-13 ENCOUNTER — Encounter (HOSPITAL_COMMUNITY): Payer: Self-pay | Admitting: Emergency Medicine

## 2021-11-13 ENCOUNTER — Ambulatory Visit (HOSPITAL_COMMUNITY)
Admission: EM | Admit: 2021-11-13 | Discharge: 2021-11-13 | Disposition: A | Discharge status: Home / Self Care | Payer: BC Managed Care – PPO | Attending: Emergency Medicine | Admitting: Emergency Medicine

## 2021-11-13 DIAGNOSIS — R0789 Other chest pain: Secondary | ICD-10-CM

## 2021-11-13 DIAGNOSIS — R109 Unspecified abdominal pain: Secondary | ICD-10-CM

## 2021-11-13 MED ORDER — CYCLOBENZAPRINE HCL 10 MG PO TABS: 10.0000 mg | ORAL_TABLET | Freq: Every day | ORAL | 0 refills | Status: AC

## 2021-11-13 MED ORDER — KETOROLAC TROMETHAMINE 30 MG/ML IJ SOLN
INTRAMUSCULAR | Status: AC
  Filled 2021-11-13: qty 1

## 2021-11-13 MED ORDER — KETOROLAC TROMETHAMINE 30 MG/ML IJ SOLN
30.0000 mg | Freq: Once | INTRAMUSCULAR | Status: AC
  Administered 2021-11-13: 30 mg via INTRAMUSCULAR

## 2021-11-13 MED ORDER — NAPROXEN 500 MG PO TABS
500.0000 mg | ORAL_TABLET | Freq: Two times a day (BID) | ORAL | 0 refills | Status: AC
Start: 2021-11-13 — End: ?

## 2021-11-13 NOTE — ED Provider Notes (Addendum)
[place]    CSN: [id number] Arrival date & time: 11/13/21  [id number]      History   Chief Complaint Chief Complaint  Patient presents with   Back Pain   Arm Pain    HPI Jay Manning is a 36 y.o. male.   Patient presents with pain to the left upper back and tricep beginning 4 days ago after working out at [employer], was lifting weights.  Pain has progressively worsened.  Range of motion intact.  Pain is worsened by deep breathing, not affected by movement.  Endorses that pain awoke him from his sleep last night, upon standing he became dizzy, fainted and hit hitting kitchen, found by his wife.  Has abrasion to forehead.  Has attempted use of Advil and tiger balm which has been ineffective.  Denies numbness, tingling, urinary or bowel incontinence, dizziness, lightheadedness, weakness, memory or speech changes, lethargy, increased fatigue.  Past Medical History:  Diagnosis Date   GERD (gastroesophageal reflux disease)    Neutropenia (HCC)    Post-nasal drip    Vitamin D deficiency     Patient Active Problem List   Diagnosis Date Noted   Neutropenia ([place]) 04/20/2019   Vitamin D deficiency 04/20/2019    Past Surgical History:  Procedure Laterality Date   EXTRACORPOREAL SHOCK WAVE LITHOTRIPSY Left 08/14/2020   Procedure: EXTRACORPOREAL SHOCK WAVE LITHOTRIPSY ([facility]);  Surgeon: Ceasar Mons, MD;  Location: [facility];  Service: Urology;  Laterality: Left;   TONSILLECTOMY  2007       Home Medications    Prior to Admission medications   Medication Sig Start Date End Date Taking? Authorizing Provider  acetaminophen (TYLENOL) 325 MG tablet Take 650 mg by mouth every 6 (six) hours as needed for moderate pain or headache.    [provider]  aspirin-sod bicarb-citric acid (ALKA-SELTZER) 325 MG TBEF tablet Take 325 mg by mouth every 6 (six) hours as needed (stomach pain).    [provider]  calcium carbonate (TUMS  - DOSED IN MG ELEMENTAL CALCIUM) 500 MG chewable tablet Chew 2 tablets by mouth daily as needed for indigestion or heartburn.    [provider]  HYDROcodone-acetaminophen (NORCO/VICODIN) 5-325 MG tablet Take 1 tablet by mouth every 4 (four) hours as needed. 08/14/20   Ceasar Mons, MD  Lactobacillus (ACIDOPHILUS PO) Take 1 tablet by mouth daily.    [provider]  multivitamin (ONE-A-DAY MEN'S) TABS tablet Take 1 tablet by mouth daily.    [provider]  naproxen (NAPROSYN) 500 MG tablet Take 1 tablet (500 mg total) by mouth 2 (two) times daily. 08/09/20   Maudie Flakes, MD  tamsulosin (FLOMAX) 0.4 MG CAPS capsule Take 1 capsule (0.4 mg total) by mouth daily. 08/09/20   Maudie Flakes, MD    Family History Family History  Problem Relation Age of Onset   Hypothyroidism Mother    Diabetes Maternal Grandmother    Diabetes Maternal Grandfather    Heart attack Paternal Grandmother    Gout Paternal Grandfather     Social History Social History   Tobacco Use   Smoking status: Never   Smokeless tobacco: Never  Substance Use Topics   Alcohol use: Yes    Alcohol/week: 7.0 - 14.0 standard drinks    Types: 7 - 14 Standard drinks or equivalent per week   Drug use: Yes    Types: Psilocybin     Allergies   Gluten meal and Lactose intolerance (  gi)   Review of Systems Review of Systems  Constitutional: Negative.   Respiratory: Negative.    Cardiovascular: Negative.   Musculoskeletal:  Positive for back pain and myalgias. Negative for arthralgias, gait problem, joint swelling, neck pain and neck stiffness.  Skin: Negative.   Neurological: Negative.     Physical Exam Triage Vital Signs ED Triage Vitals  Enc Vitals Group     BP 11/13/21 0937 128/77     Pulse Rate 11/13/21 0937 69     Resp 11/13/21 0937 16     Temp 11/13/21 0937 97.9 F (36.6 C)     Temp Source 11/13/21 0937 Oral     SpO2 11/13/21 0937 99 %     Weight --      Height --       Head Circumference --      Peak Flow --      Pain Score 11/13/21 0936 8     Pain Loc --      Pain Edu? --      Excl. in [place]? --    No data found.  Updated Vital Signs BP 128/77 (BP Location: Right Arm)   Pulse 69   Temp 97.9 F (36.6 C) (Oral)   Resp 16   SpO2 99%   Visual Acuity Right Eye Distance:   Left Eye Distance:   Bilateral Distance:    Right Eye Near:   Left Eye Near:    Bilateral Near:     Physical Exam Constitutional:      Appearance: Normal appearance.  HENT:     Head: Normocephalic.  Eyes:     Extraocular Movements: Extraocular movements intact.  Cardiovascular:     Rate and Rhythm: Normal rate and regular rhythm.     Pulses: Normal pulses.     Heart sounds: Normal heart sounds.  Pulmonary:     Effort: Pulmonary effort is normal.     Breath sounds: Normal breath sounds.  Musculoskeletal:     Comments: Tenderness present over the left flank near ribs 3 through 5, no ecchymosis, swelling or deformity noted at site, no tenderness reproduced over the cervical, thoracic or lumbar region of back, tenderness present over the left chest wall without ecchymosis, swelling or deformity noted   Skin:    Comments: 0.5 cm abrasion noted to the center of the forehead without swelling or tenderness  Neurological:     Mental Status: He is alert and oriented to person, place, and time. Mental status is at baseline.  Psychiatric:        Mood and Affect: Mood normal.        Behavior: Behavior normal.     UC Treatments / Results  Labs (all labs ordered are listed, but only abnormal results are displayed) Labs Reviewed - No data to display  EKG   Radiology No results found.  Procedures Procedures (including critical care time)  Medications Ordered in UC Medications - No data to display  Initial Impression / Assessment and Plan / UC Course  I have reviewed the triage vital signs and the nursing notes.  Pertinent labs & imaging results that were  available during my care of the patient were reviewed by me and considered in my medical decision making (see chart for details).  Acute left flank pain Acute chest wall pain  Etiology is most likely muscular, discussed with patient as he did have a syncopal episode EKG obtained, showing normal sinus rhythm, saturation 99%.,  Lungs are clear to  auscultation discussed with patient, syncope most likely related to pain, do not believe cardiac or respiratory involvement at this time, however given strict precautions that if it occurs again he will need to go to the nearest emergency department for a full evaluation and work-up, vital signs are stable, patient is in no signs of distress, neurologically without abnormality, given strict signs of a concussion and if occurs to go to the nearest emergency department for further evaluation and work-up, Toradol injection given in office for management of discomfort, prescribed naproxen twice daily for 5 days then as needed as well as muscle relaxer for outpatient management, recommend ice or heat over the affected area, compression as needed and rest for management, given walker referral to orthopedics if symptoms persist past 2 weeks Final Clinical Impressions(s) / UC Diagnoses   Final diagnoses:  None   Discharge Instructions   None    ED Prescriptions   None    PDMP not reviewed this encounter.   Hans Eden, NP 11/13/21 1103    Lowella Petties R, NP 11/13/21 1104

## 2021-11-13 NOTE — Discharge Instructions (Addendum)
Your pain is most likely caused by irritation to the muscles .   Starting tomorrow take naproxen twice daily for 5 days then as needed  You may use muscle relaxer at bedtime for additional comfort, be mindful this medication may make you drowsy  You may use heating pad in 15 minute intervals as needed for additional comfort or  you may find comfort in using ice in 10-15 minutes over affected area  Begin stretching affected area daily for 10 minutes as tolerated to further loosen muscles   When lying down place pillow underneath and between knees for support  Can try sleeping without pillow on firm mattress   Practice good posture: head back, shoulders back, chest forward, pelvis back and weight distributed evenly on both legs  If pain persist after recommended treatment or reoccurs if may be beneficial to follow up with orthopedic specialist for evaluation, this doctor specializes in the bones and can manage your symptoms long-term with options such as but not limited to imaging, medications or physical therapy   Your EKG showed that your heart was beating at a normal rhythm and pace therefore I do not believe it is the cause of you passing out last night however if you faint again he will need to go to the nearest emergency department for a full work-up

## 2021-11-13 NOTE — ED Triage Notes (Signed)
Pt reports back and left tricep pain that started Tuesday night after working out. Reports pain is really bad when taking a deep breath.  Reports pain gotten worse and took a pain pill then dizzy around 330am and fell hitting head in kitchen.

## 2023-11-25 ENCOUNTER — Other Ambulatory Visit: Payer: Self-pay

## 2023-11-25 ENCOUNTER — Encounter (HOSPITAL_COMMUNITY): Payer: Self-pay | Admitting: *Deleted

## 2023-11-25 ENCOUNTER — Ambulatory Visit (HOSPITAL_COMMUNITY)
Admission: EM | Admit: 2023-11-25 | Discharge: 2023-11-25 | Disposition: A | Discharge status: Home / Self Care | Payer: PRIVATE HEALTH INSURANCE | Attending: Internal Medicine | Admitting: Internal Medicine

## 2023-11-25 DIAGNOSIS — J0110 Acute frontal sinusitis, unspecified: Secondary | ICD-10-CM | POA: Diagnosis not present

## 2023-11-25 MED ORDER — AMOXICILLIN-POT CLAVULANATE 875-125 MG PO TABS
1.0000 | ORAL_TABLET | Freq: Two times a day (BID) | ORAL | 0 refills | Status: AC
Start: 2023-11-25 — End: ?

## 2023-11-25 NOTE — ED Triage Notes (Signed)
 PT reports since last WED. He has had a sore throat,congestion,chills ,fever and HA. Pt also reports facial pressure. Pt took OTC with out relief.

## 2023-11-25 NOTE — Discharge Instructions (Addendum)
 Your evaluation shows you have a bacterial sinus infection. - Take antibiotic sent to pharmacy as directed to treat sinus infection. - Purchase Mucinex over the counter and take this every 12 hours as needed for nasal congestion and cough. - over the counter cough medicines as needed. - Warm compresses to the cheeks and forehead as needed to help with sinus headaches as well as tylenol  as needed.  If you develop any new or worsening symptoms or if your symptoms do not start to improve, please return here or follow-up with your primary care provider. If your symptoms are severe, please go to the emergency room.

## 2023-11-25 NOTE — ED Provider Notes (Signed)
 [facility]    CSN: [id number] Arrival date & time: 11/25/23  1816      History   Chief Complaint Chief Complaint  Patient presents with   Fever   Headache   Chills   Sore Throat    HPI Jay Manning Jay Manning is a 38 y.o. male.   Jay Manning is a 38 y.o. male presenting for chief complaint of Fever, Headache, Chills, and Sore Throat that started 9 days ago. Headache bothers him the most. Headache is generalized and worse in the mornings. Denies visual disturbance, head injury, and neck pain. He feels chills without known fever at home.  Nasal congestion is thick and yellow.  He has a slight cough but he attributes this to postnasal drainage.  Denies shortness of breath, nausea, vomiting, diarrhea, abdominal pain, rash, and chest pain/palpitations.  Denies recent antibiotic or steroid use.  Taking over-the-counter medications with minimal relief.   Fever Associated symptoms: headaches   Headache Associated symptoms: fever   Sore Throat Associated symptoms include headaches.    Past Medical History:  Diagnosis Date   GERD (gastroesophageal reflux disease)    Neutropenia (HCC)    Post-nasal drip    Vitamin D deficiency     Patient Active Problem List   Diagnosis Date Noted   Neutropenia (HCC) 04/20/2019   Vitamin D deficiency 04/20/2019    Past Surgical History:  Procedure Laterality Date   EXTRACORPOREAL SHOCK WAVE LITHOTRIPSY Left 08/14/2020   Procedure: EXTRACORPOREAL SHOCK WAVE LITHOTRIPSY (ESWL);  Surgeon: Adelbert Homans, MD;  Location: [facility];  Service: Urology;  Laterality: Left;   TONSILLECTOMY  2007       Home Medications    Prior to Admission medications   Medication Sig Start Date End Date Taking? Authorizing Provider  amoxicillin-clavulanate (AUGMENTIN) 875-125 MG tablet Take 1 tablet by mouth every 12 (twelve) hours. 11/25/23  Yes Starlene Eaton, FNP  aspirin-sod bicarb-citric acid  (ALKA-SELTZER) 325 MG TBEF tablet Take 325 mg by mouth every 6 (six) hours as needed (stomach pain).   Yes [provider]  calcium carbonate (TUMS - DOSED IN MG ELEMENTAL CALCIUM) 500 MG chewable tablet Chew 2 tablets by mouth daily as needed for indigestion or heartburn.   Yes [provider]  multivitamin (ONE-A-DAY MEN'S) TABS tablet Take 1 tablet by mouth daily.   Yes [provider]  acetaminophen  (TYLENOL ) 325 MG tablet Take 650 mg by mouth every 6 (six) hours as needed for moderate pain or headache.    [provider]  cyclobenzaprine  (FLEXERIL ) 10 MG tablet Take 1 tablet (10 mg total) by mouth at bedtime. 11/13/21   White, Maybelle Spatz, NP  Lactobacillus (ACIDOPHILUS PO) Take 1 tablet by mouth daily.    [provider]  naproxen  (NAPROSYN ) 500 MG tablet Take 1 tablet (500 mg total) by mouth 2 (two) times daily. 11/13/21   Reena Canning, NP    Family History Family History  Problem Relation Age of Onset   Hypothyroidism Mother    Diabetes Maternal Grandmother    Diabetes Maternal Grandfather    Heart attack Paternal Grandmother    Gout Paternal Grandfather     Social History Social History   Tobacco Use   Smoking status: Never   Smokeless tobacco: Never  Substance Use Topics   Alcohol use: Yes    Alcohol/week: 7.0 - 14.0 standard drinks of alcohol    Types: 7 - 14 Standard drinks or equivalent per  week   Drug use: Yes    Types: Psilocybin     Allergies   Gluten meal and Lactose intolerance (gi)   Review of Systems Review of Systems  Constitutional:  Positive for fever.  Neurological:  Positive for headaches.     Physical Exam Triage Vital Signs ED Triage Vitals  Encounter Vitals Group     BP 11/25/23 1913 114/73     Systolic BP Percentile --      Diastolic BP Percentile --      Pulse Rate 11/25/23 1913 73     Resp 11/25/23 1913 20     Temp 11/25/23 1913 98.3 F (36.8 C)     Temp src --      SpO2 11/25/23  1913 98 %     Weight --      Height --      Head Circumference --      Peak Flow --      Pain Score 11/25/23 1911 4     Pain Loc --      Pain Education --      Exclude from Growth Chart --    No data found.  Updated Vital Signs BP 114/73   Pulse 73   Temp 98.3 F (36.8 C)   Resp 20   SpO2 98%   Visual Acuity Right Eye Distance:   Left Eye Distance:   Bilateral Distance:    Right Eye Near:   Left Eye Near:    Bilateral Near:     Physical Exam Vitals and nursing note reviewed.  Constitutional:      Appearance: He is not ill-appearing or toxic-appearing.  HENT:     Head: Normocephalic and atraumatic.     Jaw: There is normal jaw occlusion.     Right Ear: Hearing, tympanic membrane, ear canal and external ear normal.     Left Ear: Hearing, tympanic membrane, ear canal and external ear normal.     Nose: Congestion present.     Right Sinus: Frontal sinus tenderness present.     Left Sinus: Frontal sinus tenderness present.     Mouth/Throat:     Lips: Pink.     Mouth: Mucous membranes are moist. No injury or oral lesions.     Dentition: Normal dentition.     Tongue: No lesions.     Pharynx: Oropharynx is clear. Uvula midline. No pharyngeal swelling, oropharyngeal exudate, posterior oropharyngeal erythema, uvula swelling or postnasal drip.     Tonsils: No tonsillar exudate.  Eyes:     General: Lids are normal. Vision grossly intact. Gaze aligned appropriately.     Extraocular Movements: Extraocular movements intact.     Conjunctiva/sclera: Conjunctivae normal.  Neck:     Trachea: Trachea and phonation normal.  Cardiovascular:     Rate and Rhythm: Normal rate and regular rhythm.     Heart sounds: Normal heart sounds, S1 normal and S2 normal.  Pulmonary:     Effort: Pulmonary effort is normal. No respiratory distress.     Breath sounds: Normal breath sounds and air entry. No wheezing, rhonchi or rales.  Chest:     Chest wall: No tenderness.  Musculoskeletal:      Cervical back: Neck supple.  Lymphadenopathy:     Cervical: No cervical adenopathy.  Skin:    General: Skin is warm and dry.     Capillary Refill: Capillary refill takes less than 2 seconds.     Findings: No rash.  Neurological:  General: No focal deficit present.     Mental Status: He is alert and oriented to person, place, and time. Mental status is at baseline.     Cranial Nerves: No dysarthria or facial asymmetry.  Psychiatric:        Mood and Affect: Mood normal.        Speech: Speech normal.        Behavior: Behavior normal.        Thought Content: Thought content normal.        Judgment: Judgment normal.      UC Treatments / Results  Labs (all labs ordered are listed, but only abnormal results are displayed) Labs Reviewed - No data to display  EKG   Radiology No results found.  Procedures Procedures (including critical care time)  Medications Ordered in UC Medications - No data to display  Initial Impression / Assessment and Plan / UC Course  I have reviewed the triage vital signs and the nursing notes.  Pertinent labs & imaging results that were available during my care of the patient were reviewed by me and considered in my medical decision making (see chart for details).   1. Acute non-recurrent frontal sinusitis Presentation is consistent with acute postviral bacterial sinusitis.   Symptoms have been present for greater than 9 days and have not responded well to over-the-counter therapies, therefore may have antibiotic.  Prescriptions for further symptomatic relief sent, may continue using OTC medications as needed. Deferred imaging of the chest based on stable cardiopulmonary exam and hemodynamically stable vital signs. Recommend warm compresses to the sinuses as needed.  Counseled patient on potential for adverse effects with medications prescribed/recommended today, strict ER and return-to-clinic precautions discussed, patient verbalized  understanding.    Final Clinical Impressions(s) / UC Diagnoses   Final diagnoses:  Acute non-recurrent frontal sinusitis     Discharge Instructions      Your evaluation shows you have a bacterial sinus infection. - Take antibiotic sent to pharmacy as directed to treat sinus infection. - Purchase Mucinex over the counter and take this every 12 hours as needed for nasal congestion and cough. - over the counter cough medicines as needed. - Warm compresses to the cheeks and forehead as needed to help with sinus headaches as well as tylenol  as needed.  If you develop any new or worsening symptoms or if your symptoms do not start to improve, please return here or follow-up with your primary care provider. If your symptoms are severe, please go to the emergency room.    ED Prescriptions     Medication Sig Dispense Auth. Provider   amoxicillin-clavulanate (AUGMENTIN) 875-125 MG tablet Take 1 tablet by mouth every 12 (twelve) hours. 14 tablet Starlene Eaton, FNP      PDMP not reviewed this encounter.   Starlene Eaton, [place] 11/25/23 2001
